# Patient Record
Sex: Female | Born: 1970 | Race: White | Hispanic: No | Smoking: Never smoker
Health system: Southern US, Community
[De-identification: ages and names within clinical notes are randomized; demographics above are authoritative.]

---

## 2021-06-21 ENCOUNTER — Other Ambulatory Visit: Payer: Self-pay | Admitting: Family Medicine

## 2021-06-21 DIAGNOSIS — H93A2 Pulsatile tinnitus, left ear: Secondary | ICD-10-CM

## 2021-06-22 ENCOUNTER — Ambulatory Visit
Admission: RE | Admit: 2021-06-22 | Discharge: 2021-06-22 | Disposition: A | Discharge status: Home / Self Care | Payer: Self-pay | Source: Ambulatory Visit | Attending: Family Medicine | Admitting: Family Medicine

## 2021-06-22 ENCOUNTER — Ambulatory Visit
Admission: RE | Admit: 2021-06-22 | Discharge: 2021-06-22 | Disposition: A | Discharge status: Home / Self Care | Payer: 59 | Source: Ambulatory Visit | Attending: Family Medicine | Admitting: Family Medicine

## 2021-06-22 DIAGNOSIS — H93A2 Pulsatile tinnitus, left ear: Secondary | ICD-10-CM

## 2021-06-22 IMAGING — MR MR MRA HEAD W/O CM
1 series · 22 of 48 positions shown · non-contrast
Comparison: None Available.

CLINICAL DATA: Pulsatile tinnitus

EXAM:
MRA NECK WITHOUT CONTRAST
MRA HEAD WITHOUT CONTRAST
TECHNIQUE: Angiographic images of the Circle of Willis were acquired using MRA
technique without intravenous contrast.

[Series 3: tof_3d_multi-slab · axial · 0.7mm · 0.35mm/px · z∈[-103,-8]mm · 22 of 143 slices shown]
[im 1/143]
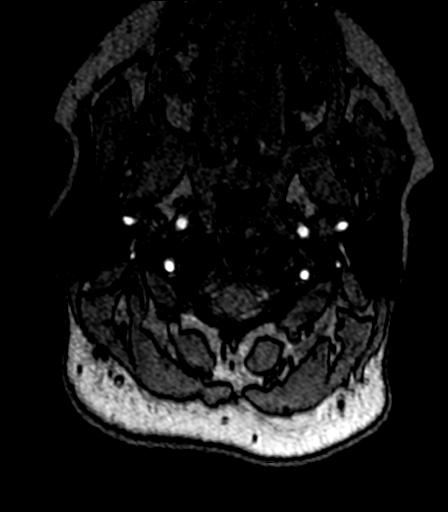
[im 4/143]
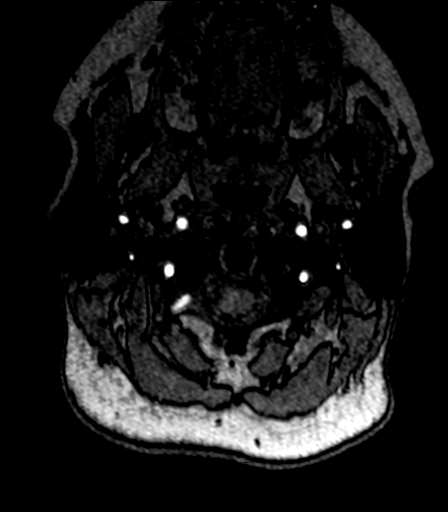
[im 7/143]
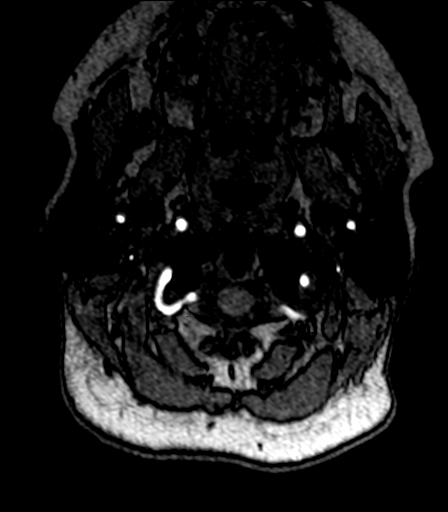
[im 10/143]
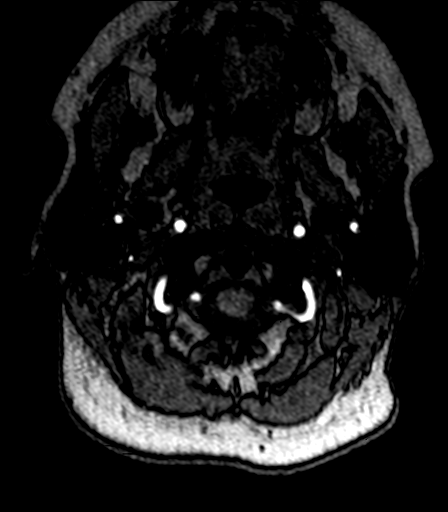
[im 13/143]
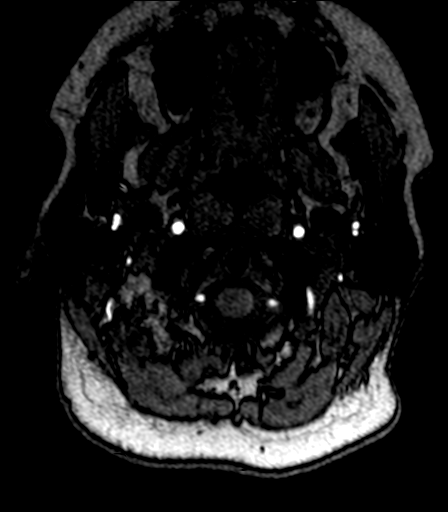
[im 16/143]
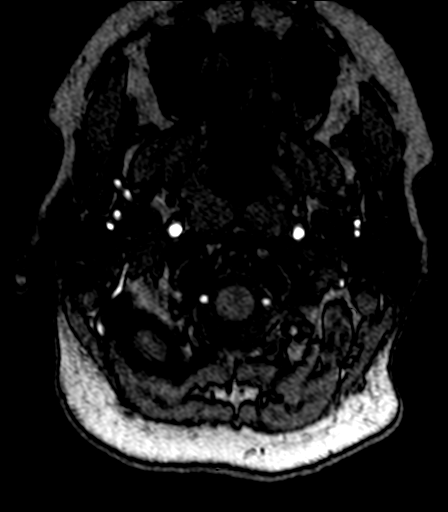
[im 19/143]
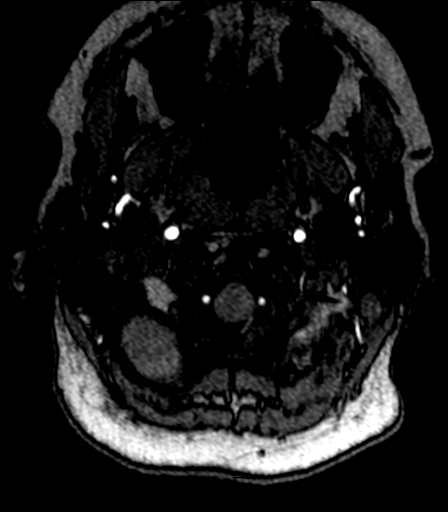
[im 22/143]
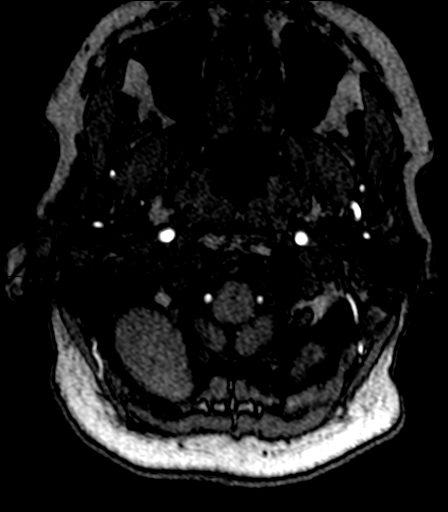
[im 25/143]
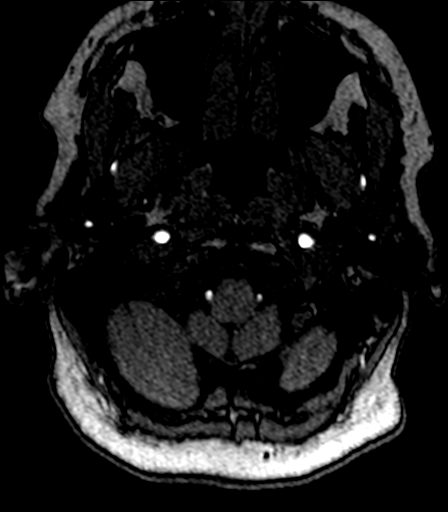
[im 28/143]
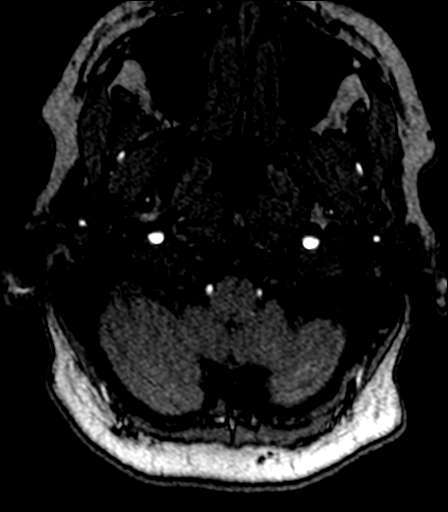
[im 31/143]
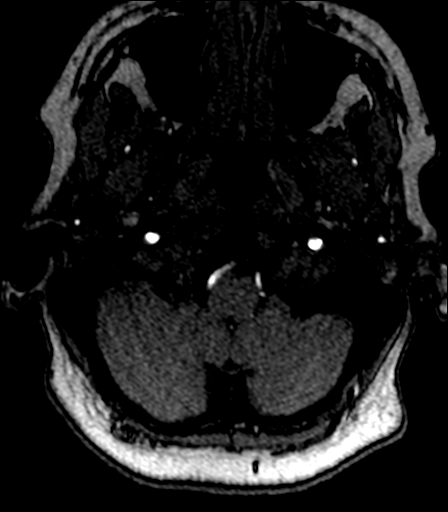
[im 34/143]
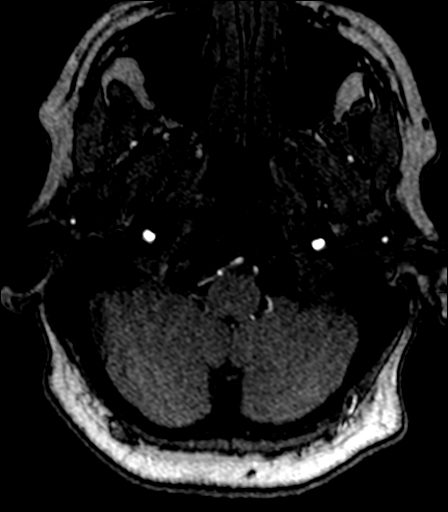
[im 37/143]
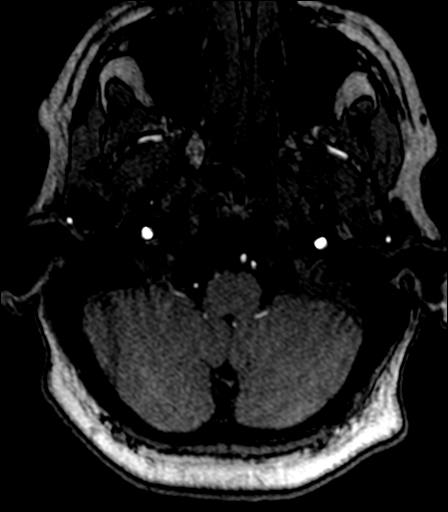
[im 40/143]
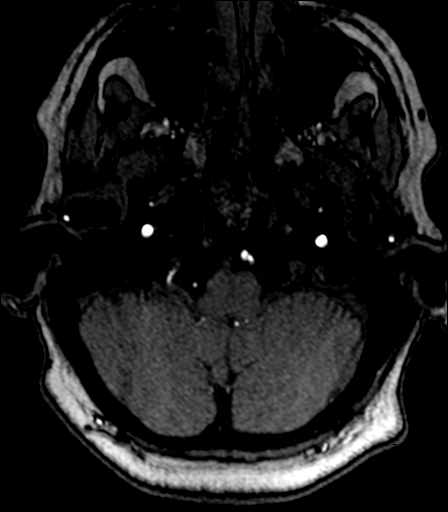
[im 46/143]
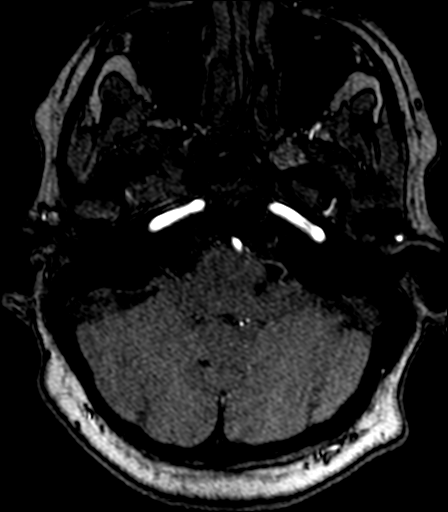
[im 64/143]
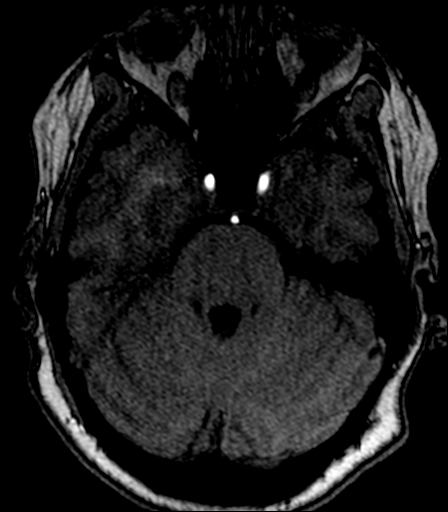
[im 73/143]
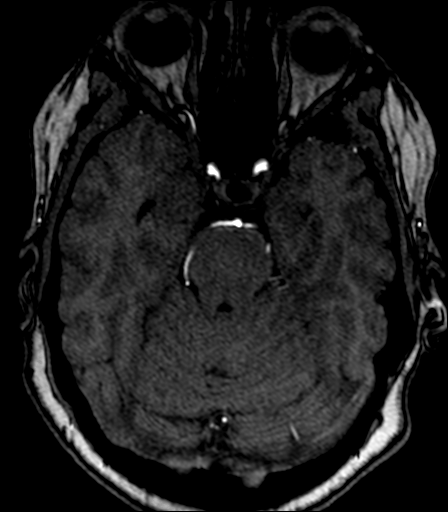
[im 82/143]
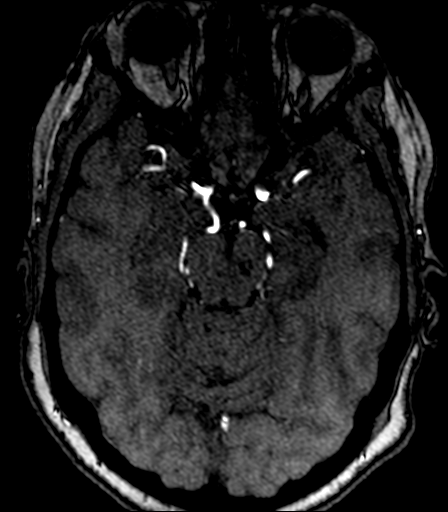
[im 100/143]
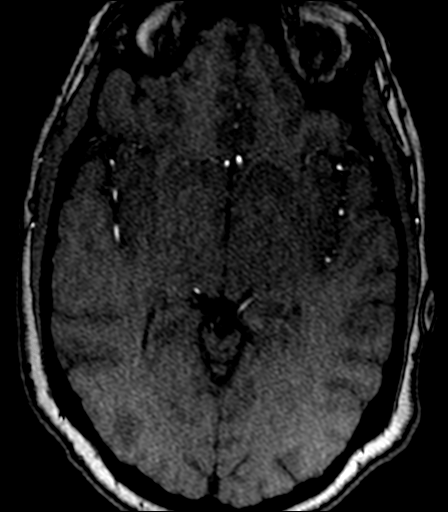
[im 118/143]
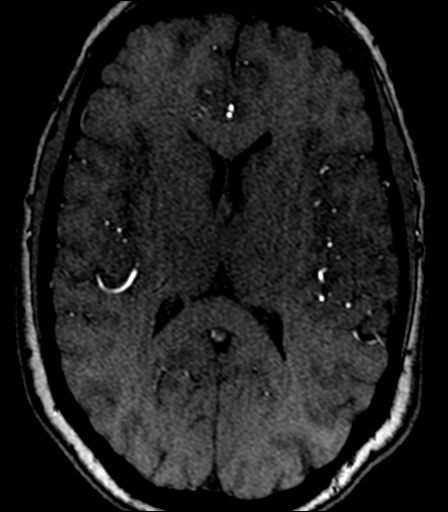
[im 121/143]
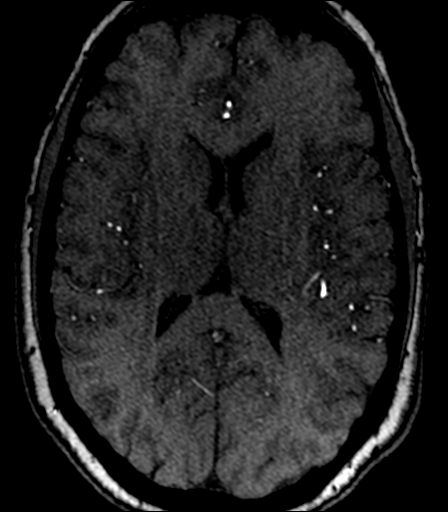
[im 136/143]
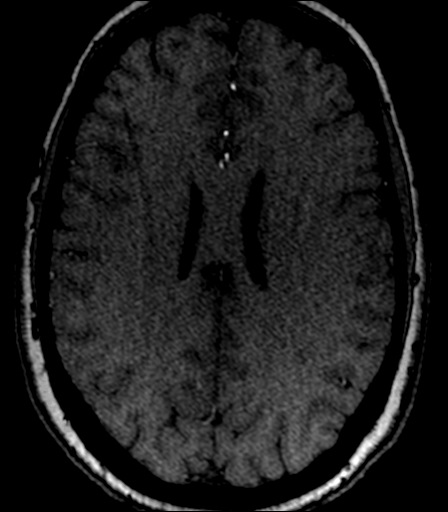

[22 of 48 positions shown; findings below may reference images not displayed]

FINDINGS: MRA NECK FINDINGS

Three-vessel arch with a common origin of the brachiocephalic and
left common carotid arteries; in addition, the left vertebral artery
originates from the aorta. Imaged portion shows no evidence of
aneurysm or dissection. No significant stenosis of the major arch
vessel origins.

Common, internal, and external carotid arteries are patent, without
hemodynamically significant stenosis. Extracranial vertebral
arteries are patent, without hemodynamically significant stenosis.

MRA HEAD FINDINGS

Anterior circulation: Both internal carotid arteries are patent to
the termini, with mild atherosclerotic disease but without
significant stenosis.

A1 segments patent. Normal anterior communicating artery. Anterior
cerebral arteries are patent to their distal aspects.

No M1 stenosis or occlusion. Normal MCA bifurcations. Distal MCA
branches perfused and symmetric.

Posterior circulation: Vertebral arteries patent to the
vertebrobasilar junction without stenosis. Posterior inferior
cerebral arteries patent bilaterally.

Basilar patent to its distal aspect. Superior cerebellar arteries
patent bilaterally.

Patent left P1. Fetal origin of the right PCA. PCAs perfused to
their distal aspects without stenosis. The left posterior
communicating artery is not visualized.

Anatomic variants: Fetal origin of the right PCA.
IMPRESSION: 1.  No intracranial large vessel occlusion or significant stenosis.
2.  No hemodynamically significant stenosis in the neck.

## 2021-06-22 IMAGING — MR MR MRA NECK W/O CM
3 series · 19 of 48 positions shown · non-contrast
Comparison: None Available.

CLINICAL DATA: Pulsatile tinnitus

EXAM:
MRA NECK WITHOUT CONTRAST
MRA HEAD WITHOUT CONTRAST
TECHNIQUE: Angiographic images of the Circle of Willis were acquired using MRA
technique without intravenous contrast.

[Series 4: fl_tof_2d · axial · 3.0mm · 0.39mm/px · z∈[-278,-51]mm · 16 of 120 slices shown]
[im 1/120]
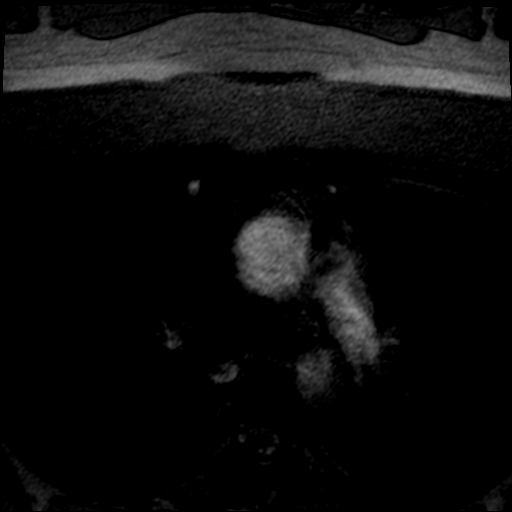
[im 3/120]
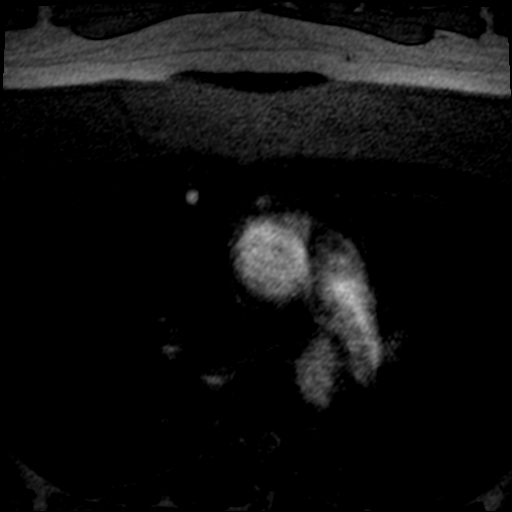
[im 6/120]
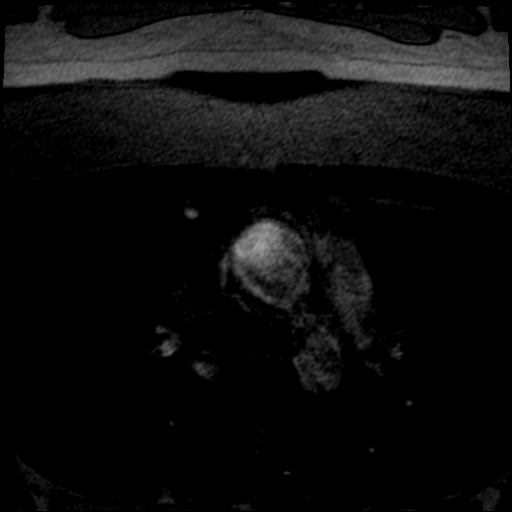
[im 9/120]
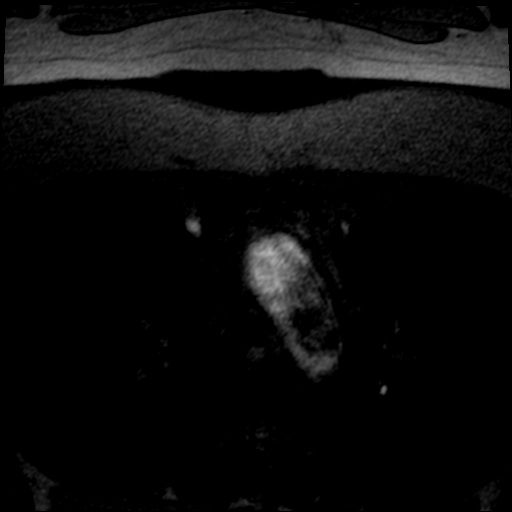
[im 11/120]
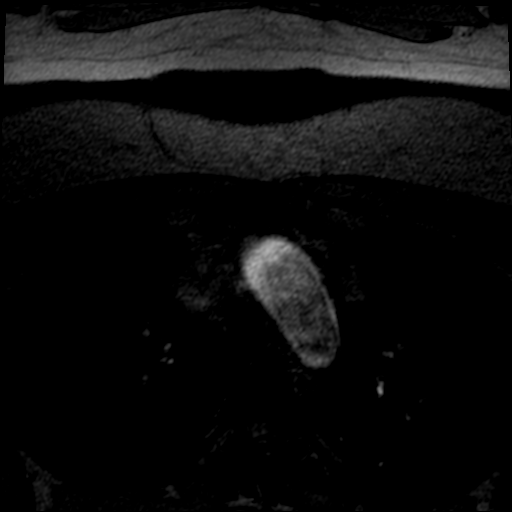
[im 14/120]
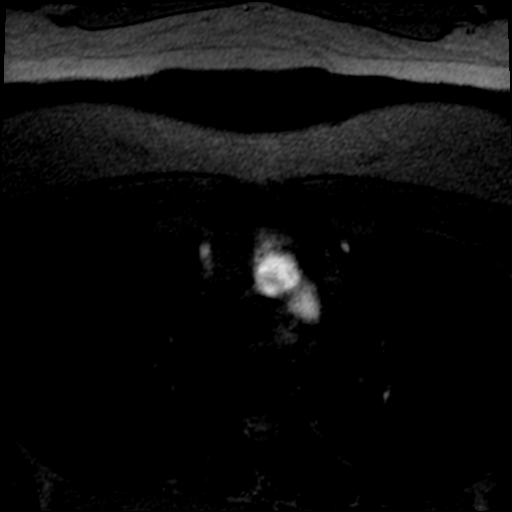
[im 19/120]
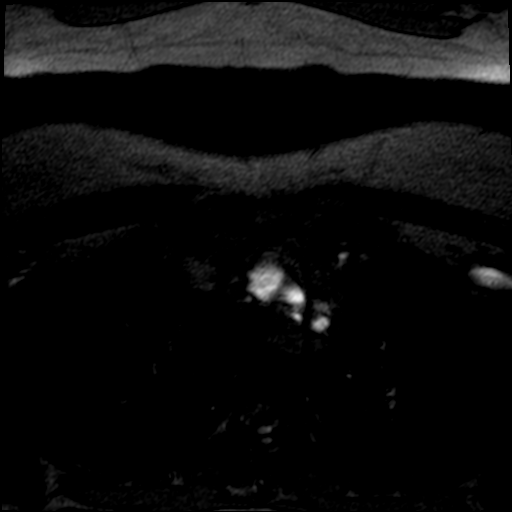
[im 22/120]
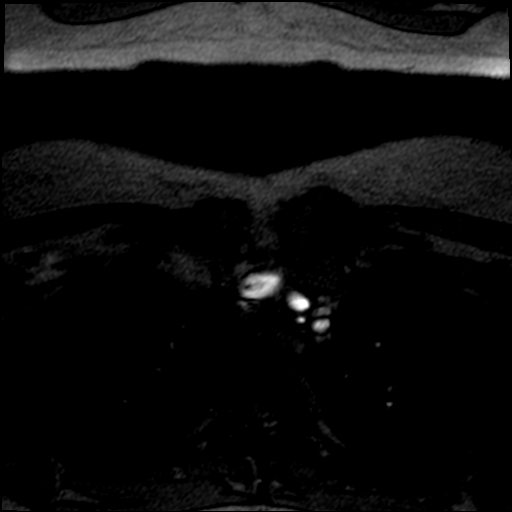
[im 38/120]
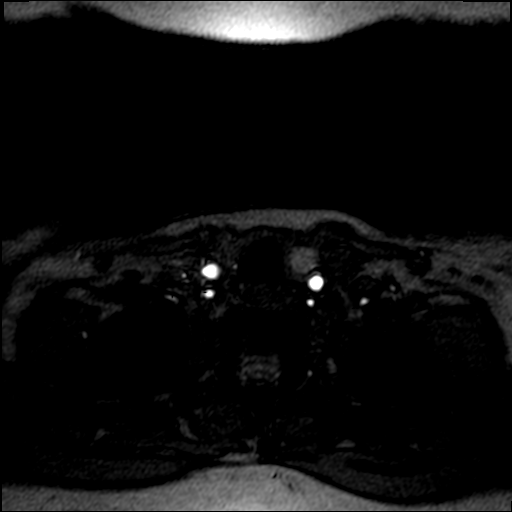
[im 52/120]
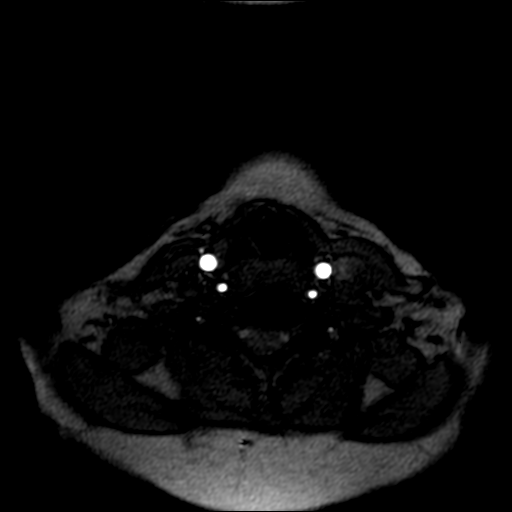
[im 60/120]
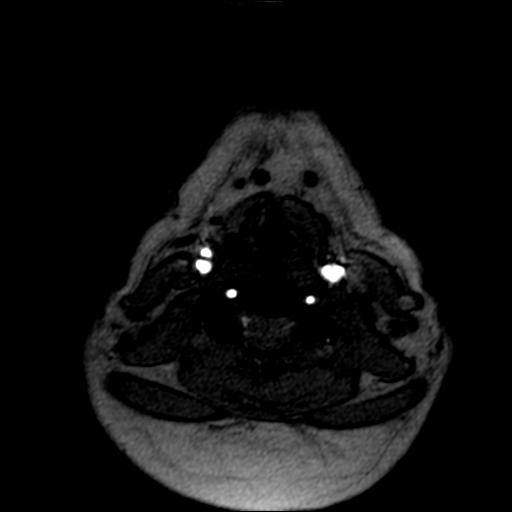
[im 68/120]
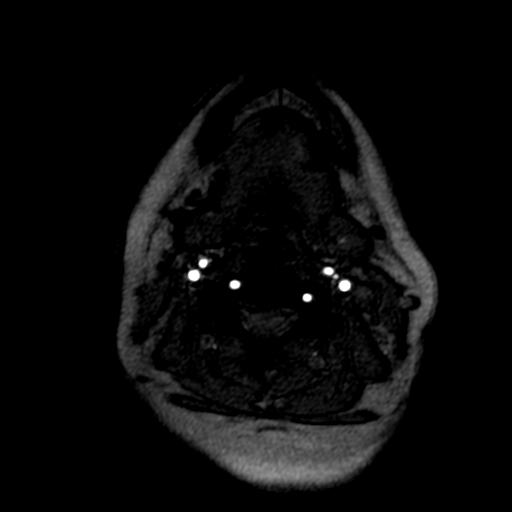
[im 82/120]
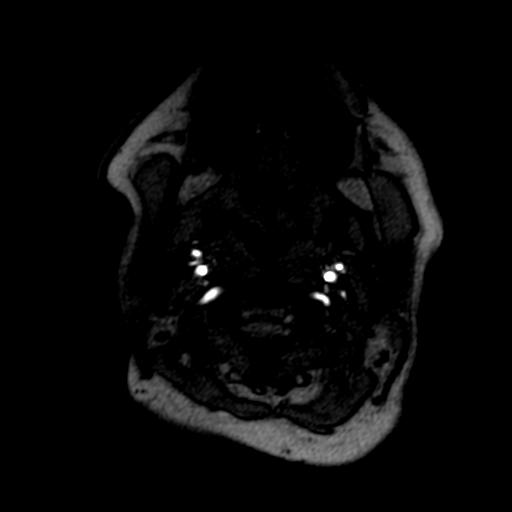
[im 98/120]
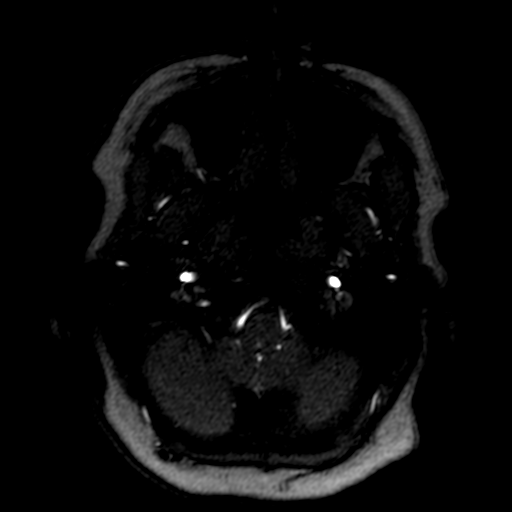
[im 101/120]
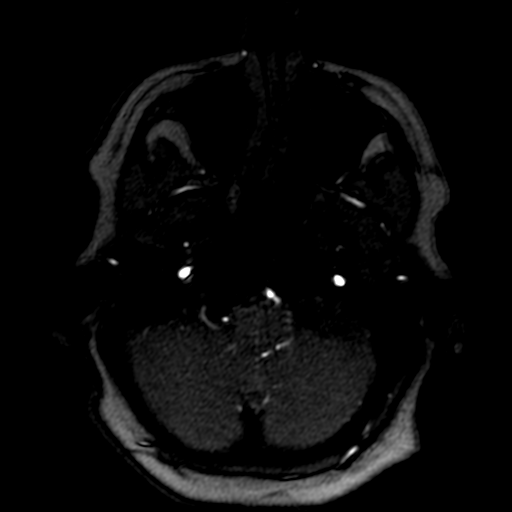
[im 114/120]
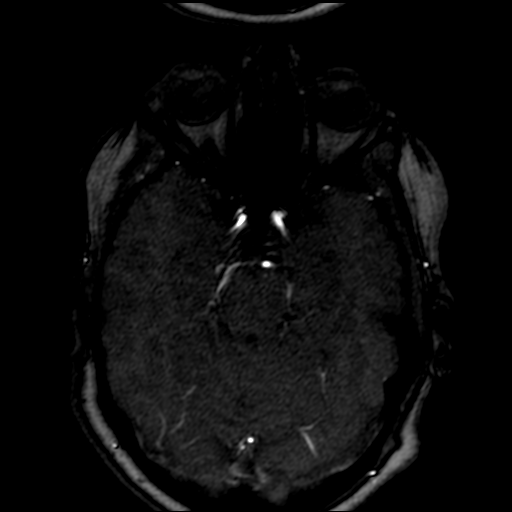

[Series 7: fl_tof_2d_mip_tra · axial · 242.2mm · 0.39mm/px · 1 of 1 slices shown]
[im 1/1]
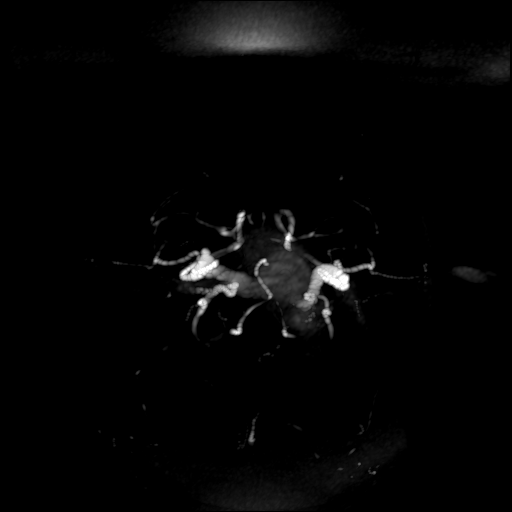

[Series 10: lcca · coronal · 0.47mm/px · 2 of 4 slices shown]
[im 1/4]
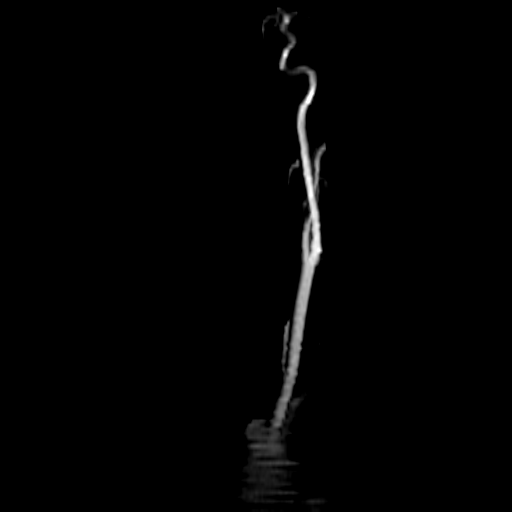
[im 4/4]
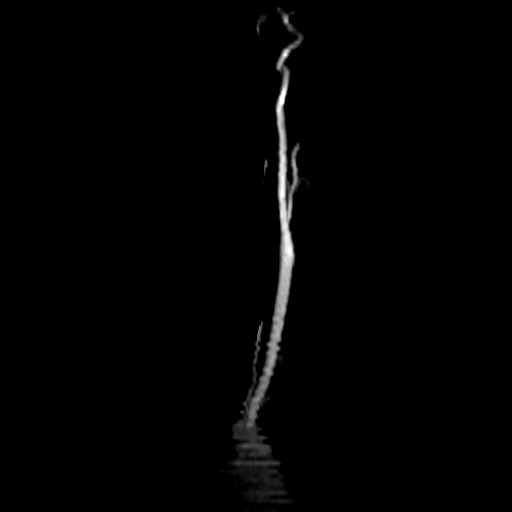

[19 of 48 positions shown; findings below may reference images not displayed]

FINDINGS: MRA NECK FINDINGS

Three-vessel arch with a common origin of the brachiocephalic and
left common carotid arteries; in addition, the left vertebral artery
originates from the aorta. Imaged portion shows no evidence of
aneurysm or dissection. No significant stenosis of the major arch
vessel origins.

Common, internal, and external carotid arteries are patent, without
hemodynamically significant stenosis. Extracranial vertebral
arteries are patent, without hemodynamically significant stenosis.

MRA HEAD FINDINGS

Anterior circulation: Both internal carotid arteries are patent to
the termini, with mild atherosclerotic disease but without
significant stenosis.

A1 segments patent. Normal anterior communicating artery. Anterior
cerebral arteries are patent to their distal aspects.

No M1 stenosis or occlusion. Normal MCA bifurcations. Distal MCA
branches perfused and symmetric.

Posterior circulation: Vertebral arteries patent to the
vertebrobasilar junction without stenosis. Posterior inferior
cerebral arteries patent bilaterally.

Basilar patent to its distal aspect. Superior cerebellar arteries
patent bilaterally.

Patent left P1. Fetal origin of the right PCA. PCAs perfused to
their distal aspects without stenosis. The left posterior
communicating artery is not visualized.

Anatomic variants: Fetal origin of the right PCA.
IMPRESSION: 1.  No intracranial large vessel occlusion or significant stenosis.
2.  No hemodynamically significant stenosis in the neck.

## 2021-07-16 ENCOUNTER — Other Ambulatory Visit: Payer: Self-pay | Admitting: Family Medicine

## 2021-07-16 DIAGNOSIS — E041 Nontoxic single thyroid nodule: Secondary | ICD-10-CM

## 2021-07-21 ENCOUNTER — Ambulatory Visit
Admission: RE | Admit: 2021-07-21 | Discharge: 2021-07-21 | Disposition: A | Discharge status: Home / Self Care | Payer: 59 | Source: Ambulatory Visit | Attending: Family Medicine | Admitting: Family Medicine

## 2021-07-21 DIAGNOSIS — E041 Nontoxic single thyroid nodule: Secondary | ICD-10-CM

## 2021-11-30 ENCOUNTER — Other Ambulatory Visit: Payer: Self-pay | Admitting: Family Medicine

## 2021-11-30 ENCOUNTER — Other Ambulatory Visit (HOSPITAL_COMMUNITY)
Admission: RE | Admit: 2021-11-30 | Discharge: 2021-11-30 | Disposition: A | Discharge status: Home / Self Care | Payer: 59 | Source: Ambulatory Visit | Attending: Family Medicine | Admitting: Family Medicine

## 2021-11-30 DIAGNOSIS — Z01411 Encounter for gynecological examination (general) (routine) with abnormal findings: Secondary | ICD-10-CM | POA: Insufficient documentation

## 2021-12-02 LAB — CYTOLOGY - PAP
Adequacy: ABSENT
Comment: NEGATIVE
Diagnosis: NEGATIVE
High risk HPV: NEGATIVE

## 2022-04-23 ENCOUNTER — Ambulatory Visit
Admission: EM | Admit: 2022-04-23 | Discharge: 2022-04-23 | Disposition: A | Discharge status: Home / Self Care | Payer: 59 | Attending: Nurse Practitioner | Admitting: Nurse Practitioner

## 2022-04-23 DIAGNOSIS — J029 Acute pharyngitis, unspecified: Secondary | ICD-10-CM

## 2022-04-23 LAB — POCT RAPID STREP A (OFFICE): Rapid Strep A Screen: NEGATIVE

## 2022-04-23 NOTE — ED Triage Notes (Signed)
Pt c/o fever and sore throat.  Started: Wednesday/ Thursday   Home interventions: OTC flu medication

## 2022-04-23 NOTE — ED Provider Notes (Addendum)
[facility]    CSN: [id number] Arrival date & time: 04/23/22  0803      History   Chief Complaint Chief Complaint  Patient presents with   Fever   Sore Throat    HPI Melinda Cunningham is a 52 y.o. female  presents for evaluation of URI symptoms for 2-3 days. Patient reports associated symptoms of ST, ear pain, and fever. Denies N/V/D, cough, congestion, body aches, or SOB. Patient does not have a hx of asthma or smoking. No known sick contacts.  Pt has taken OTC flu medicine for symptoms.  Reports 2 negative home COVID test.  Pt has no other concerns at this time.    Fever Associated symptoms: sore throat   Sore Throat    History reviewed. No pertinent past medical history.  There are no problems to display for this patient.   History reviewed. No pertinent surgical history.  OB History   No obstetric history on file.      Home Medications    Prior to Admission medications   Not on File    Family History History reviewed. No pertinent family history.  Social History Social History   Tobacco Use   Smoking status: Never   Smokeless tobacco: Never  Vaping Use   Vaping Use: Never used  Substance Use Topics   Alcohol use: Never   Drug use: Never     Allergies   Penicillins   Review of Systems Review of Systems  Constitutional:  Positive for fever.  HENT:  Positive for sore throat.      Physical Exam Triage Vital Signs ED Triage Vitals  Enc Vitals Group     BP 04/23/22 0812 108/72     Pulse Rate 04/23/22 0812 73     Resp 04/23/22 0812 16     Temp 04/23/22 0812 98 F (36.7 C)     Temp Source 04/23/22 0812 Oral     SpO2 04/23/22 0812 96 %     Weight --      Height --      Head Circumference --      Peak Flow --      Pain Score 04/23/22 0816 10     Pain Loc --      Pain Edu? --      Excl. in GC? --    No data found.  Updated Vital Signs BP 108/72 (BP Location: Right Arm)   Pulse 73   Temp 98 F (36.7 C) (Oral)   Resp 16    SpO2 96%   Visual Acuity Right Eye Distance:   Left Eye Distance:   Bilateral Distance:    Right Eye Near:   Left Eye Near:    Bilateral Near:     Physical Exam Vitals and nursing note reviewed.  Constitutional:      General: She is not in acute distress.    Appearance: She is well-developed. She is not ill-appearing.  HENT:     Head: Normocephalic and atraumatic.     Right Ear: Tympanic membrane and ear canal normal.     Left Ear: Tympanic membrane and ear canal normal.     Nose: Congestion present.     Mouth/Throat:     Mouth: Mucous membranes are moist.     Pharynx: Oropharynx is clear. Uvula midline. Posterior oropharyngeal erythema present.     Tonsils: No tonsillar exudate or tonsillar abscesses.  Eyes:     Conjunctiva/sclera: Conjunctivae normal.     Pupils:  Pupils are equal, round, and reactive to light.  Cardiovascular:     Rate and Rhythm: Normal rate and regular rhythm.     Heart sounds: Normal heart sounds.  Pulmonary:     Effort: Pulmonary effort is normal.     Breath sounds: Normal breath sounds.  Musculoskeletal:     Cervical back: Normal range of motion and neck supple.  Lymphadenopathy:     Cervical: No cervical adenopathy.  Skin:    General: Skin is warm and dry.  Neurological:     General: No focal deficit present.     Mental Status: She is alert and oriented to person, place, and time.  Psychiatric:        Mood and Affect: Mood normal.        Behavior: Behavior normal.      UC Treatments / Results  Labs (all labs ordered are listed, but only abnormal results are displayed) Labs Reviewed  CULTURE, GROUP A STREP [facility])  POCT RAPID STREP A (OFFICE)    EKG   Radiology No results found.  Procedures Procedures (including critical care time)  Medications Ordered in UC Medications - No data to display  Initial Impression / Assessment and Plan / UC Course  I have reviewed the triage vital signs and the nursing notes.  Pertinent labs  & imaging results that were available during my care of the patient were reviewed by me and considered in my medical decision making (see chart for details).     Negative rapid strep, will culture.  Declined rapid flu or COVID PCR Discussed viral illness and symptomatic treatment Salt gargles and warm liquids OTC analgesics as needed PCP follow-up if symptoms do not improve ER precautions reviewed and patient verbalized understanding Final Clinical Impressions(s) / UC Diagnoses   Final diagnoses:  Sore throat  Viral pharyngitis     Discharge Instructions      The clinic will contact you with results of your strep throat culture if it is positive Continue salt water gargles and warm liquids Over-the-counter Tylenol or ibuprofen as needed Follow-up with your PCP if your symptoms do not improve Please go to emergency room if you have any worsening symptoms   ED Prescriptions   None    PDMP not reviewed this encounter.   Radford Pax, NP 04/23/22 0831    Radford Pax, NP 04/23/22 [phone]

## 2022-04-23 NOTE — Discharge Instructions (Signed)
The clinic will contact you with results of your strep throat culture if it is positive Continue salt water gargles and warm liquids Over-the-counter Tylenol or ibuprofen as needed Follow-up with your PCP if your symptoms do not improve Please go to emergency room if you have any worsening symptoms

## 2022-04-24 LAB — CULTURE, GROUP A STREP (THRC)

## 2022-04-25 LAB — CULTURE, GROUP A STREP (THRC)

## 2022-04-26 LAB — CULTURE, GROUP A STREP (THRC)

## 2022-10-14 ENCOUNTER — Institutional Professional Consult (permissible substitution): Payer: No Typology Code available for payment source | Admitting: Plastic Surgery

## 2023-08-23 ENCOUNTER — Encounter: Payer: Self-pay | Admitting: Neurology

## 2023-10-12 ENCOUNTER — Encounter: Admitting: Neurology

## 2024-01-01 ENCOUNTER — Other Ambulatory Visit (HOSPITAL_BASED_OUTPATIENT_CLINIC_OR_DEPARTMENT_OTHER): Payer: Self-pay | Admitting: Family Medicine

## 2024-01-01 DIAGNOSIS — E7800 Pure hypercholesterolemia, unspecified: Secondary | ICD-10-CM

## 2024-01-15 ENCOUNTER — Other Ambulatory Visit: Payer: Self-pay | Admitting: Family Medicine

## 2024-01-15 DIAGNOSIS — Z1231 Encounter for screening mammogram for malignant neoplasm of breast: Secondary | ICD-10-CM

## 2024-01-17 ENCOUNTER — Ambulatory Visit (HOSPITAL_BASED_OUTPATIENT_CLINIC_OR_DEPARTMENT_OTHER)
Admission: RE | Admit: 2024-01-17 | Discharge: 2024-01-17 | Disposition: A | Discharge status: Home / Self Care | Payer: Self-pay | Source: Ambulatory Visit | Attending: Family Medicine | Admitting: Family Medicine

## 2024-01-17 DIAGNOSIS — E7800 Pure hypercholesterolemia, unspecified: Secondary | ICD-10-CM | POA: Insufficient documentation

## 2024-02-08 ENCOUNTER — Ambulatory Visit
Admission: RE | Admit: 2024-02-08 | Discharge: 2024-02-08 | Disposition: A | Discharge status: Home / Self Care | Source: Ambulatory Visit | Attending: Family Medicine | Admitting: Family Medicine

## 2024-02-08 DIAGNOSIS — Z1231 Encounter for screening mammogram for malignant neoplasm of breast: Secondary | ICD-10-CM
